# Patient Record
Sex: Female | Born: 1950 | Race: White | Hispanic: No | Marital: Married
Health system: Southern US, Community
[De-identification: ages and names within clinical notes are randomized; demographics above are authoritative.]

---

## 2005-07-04 ENCOUNTER — Encounter: Admission: RE | Admit: 2005-07-04 | Discharge: 2005-07-04 | Payer: Self-pay | Admitting: Endocrinology

## 2006-08-30 IMAGING — US US RENAL
1 series · 14 of 24 positions shown · non-contrast
Comparison: none

CLINICAL DATA: Renal vascular hypertension.  Evaluate kidney size.  
 RENAL ULTRASOUND:
TECHNIQUE: Complete ultrasound examination of the urinary tract was performed including evaluation of the kidneys, renal collecting systems, and urinary bladder.

[Series 1: unknown · 0.19mm/px · 14 of 24 slices shown]
[im 1/24]
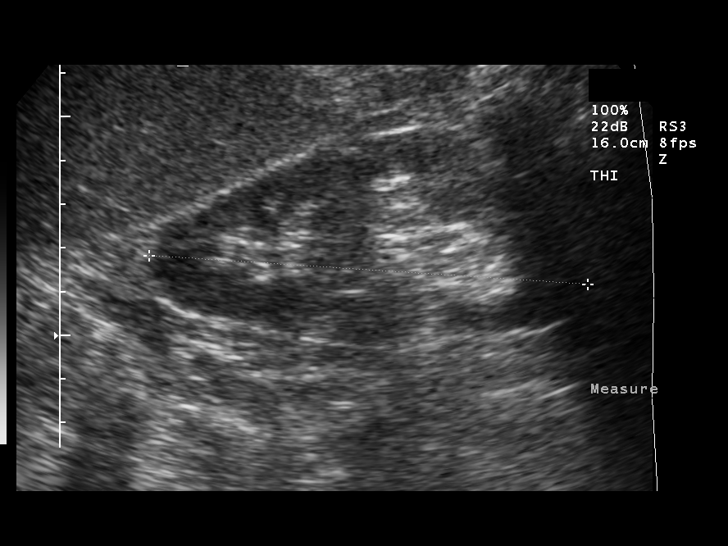
[im 3/24]
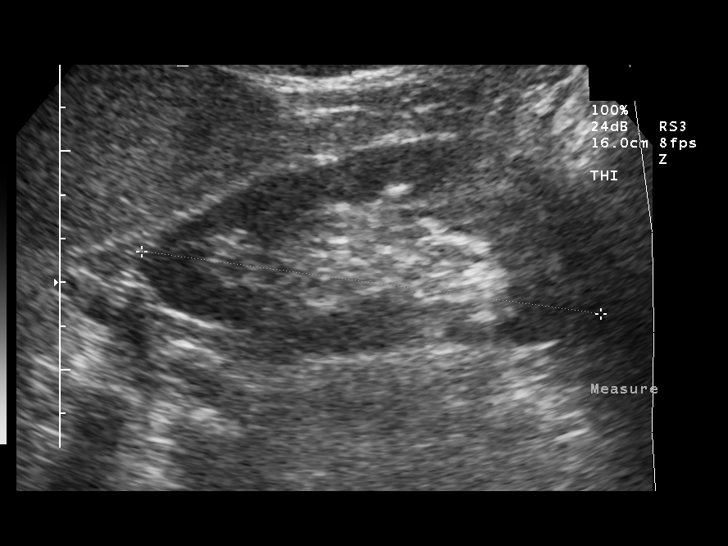
[im 5/24]
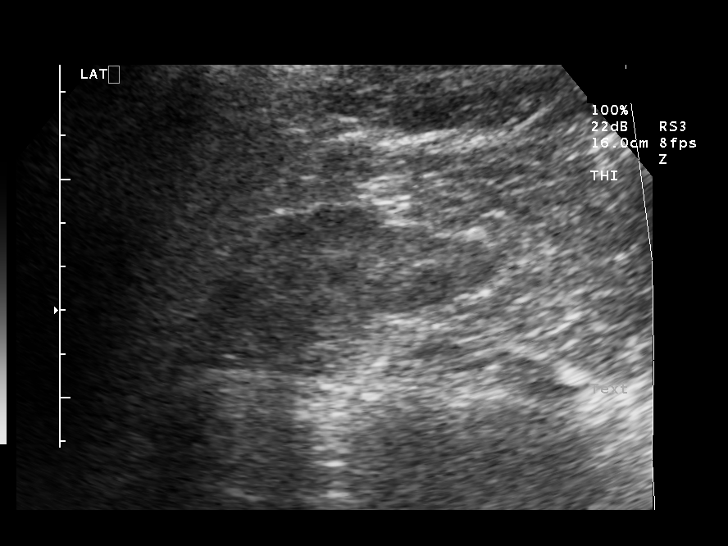
[im 7/24]
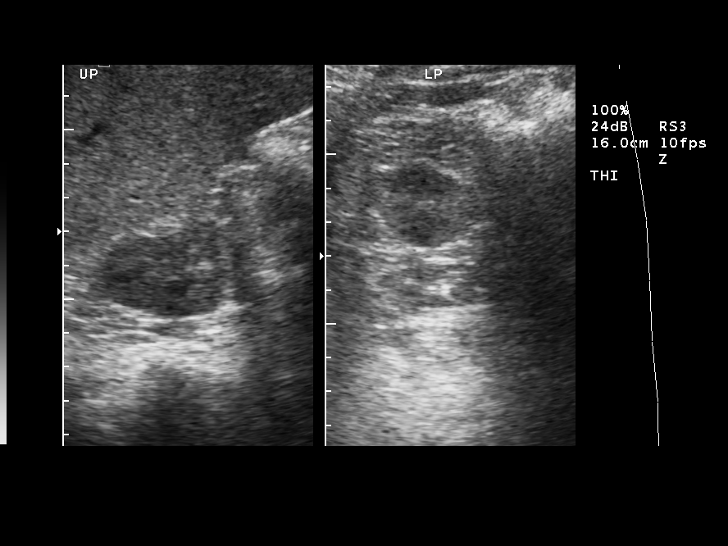
[im 8/24]
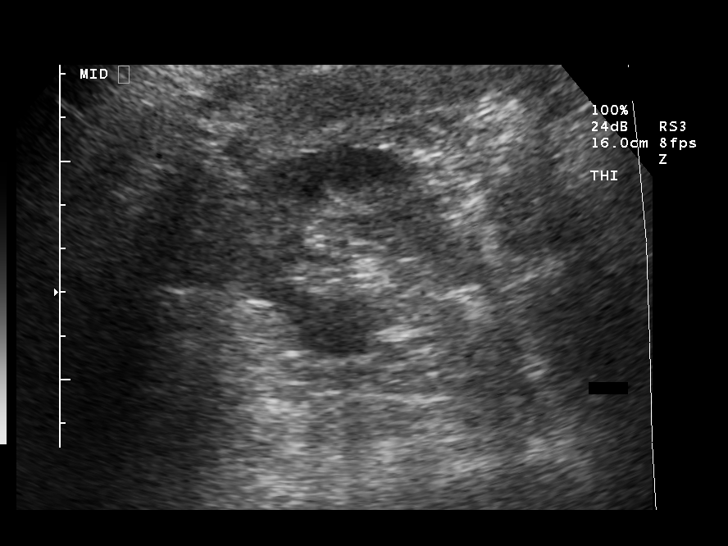
[im 10/24]
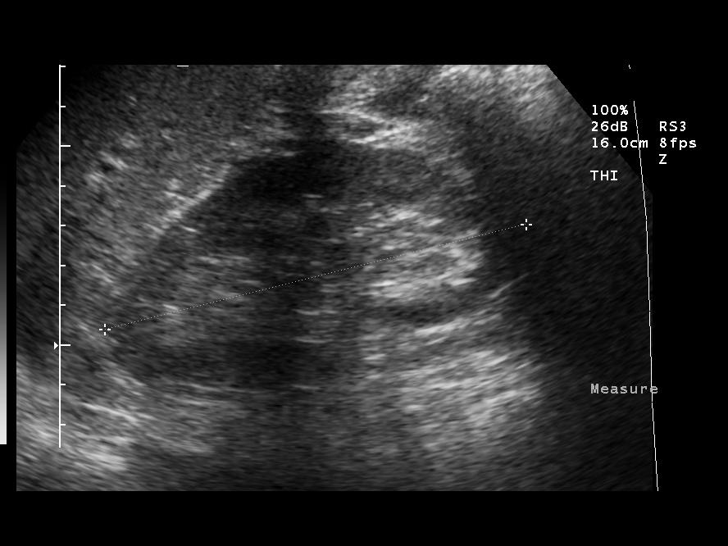
[im 12/24]
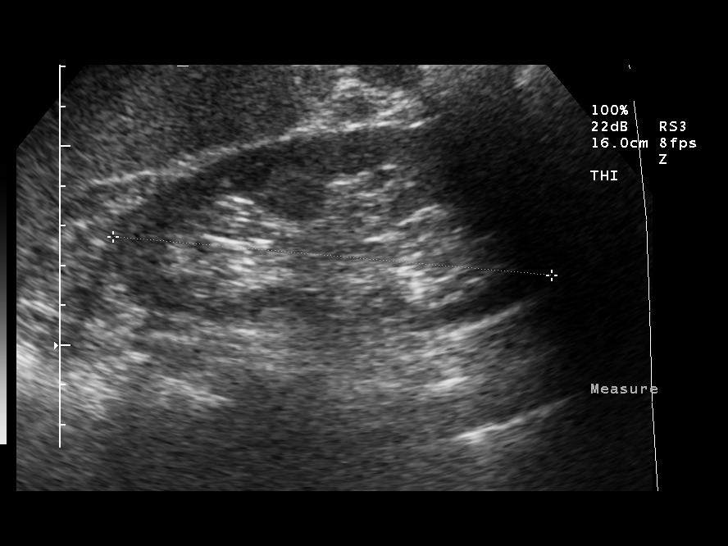
[im 13/24]
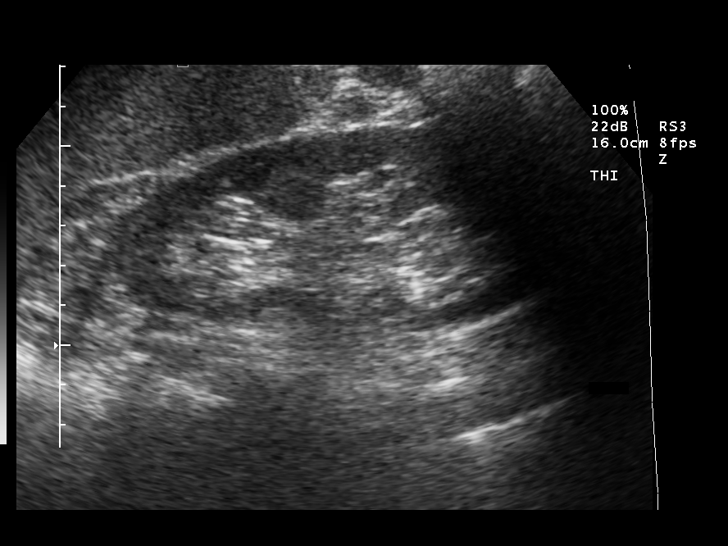
[im 15/24]
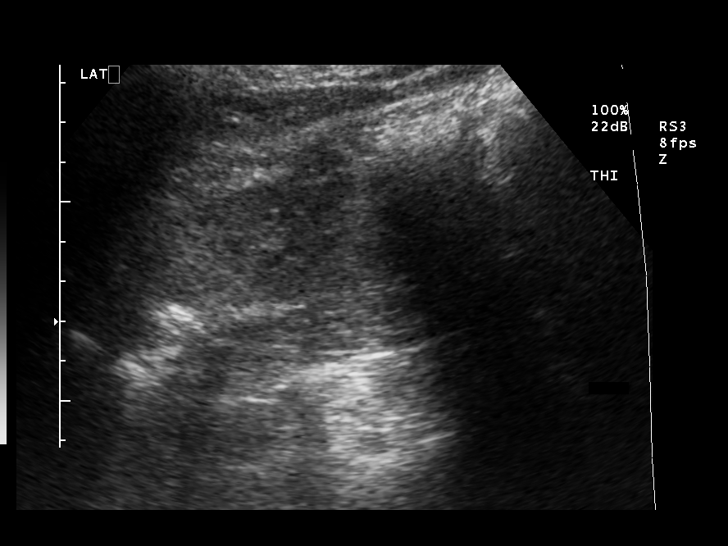
[im 17/24]
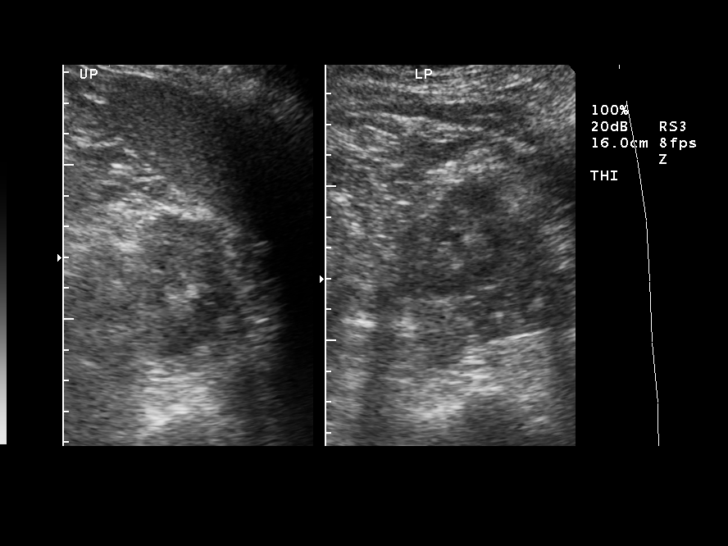
[im 19/24]
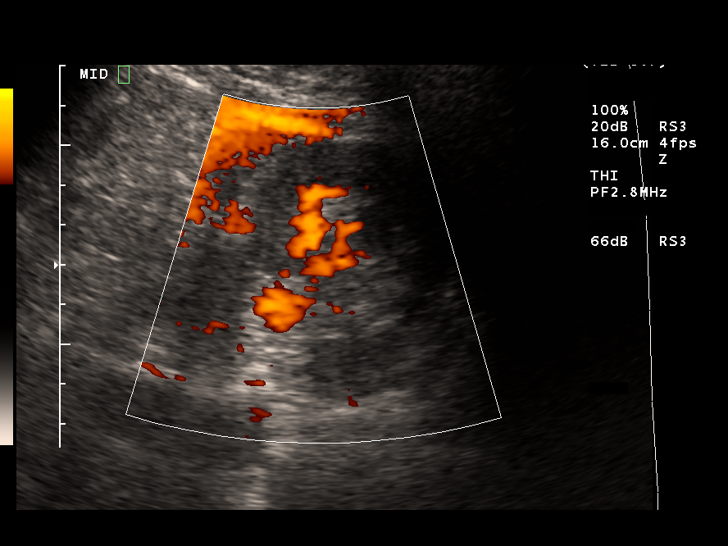
[im 20/24]
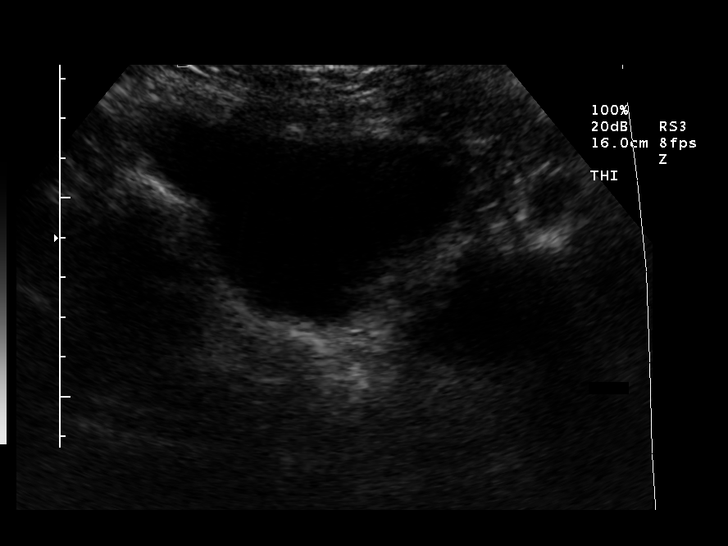
[im 22/24]
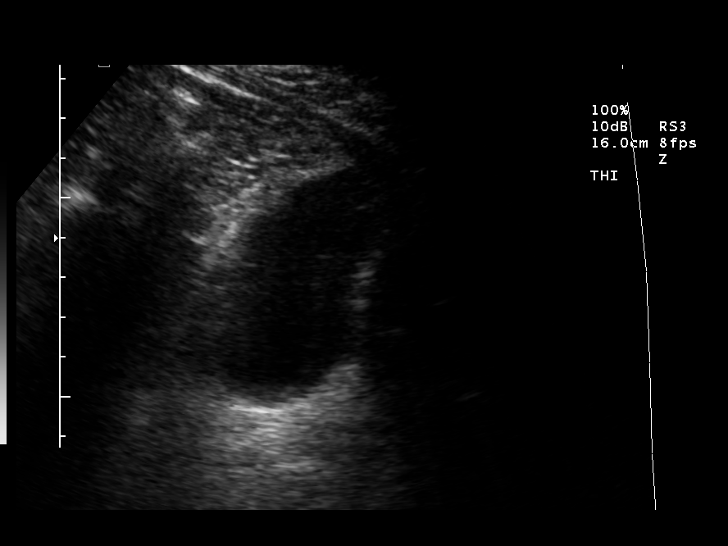
[im 24/24]
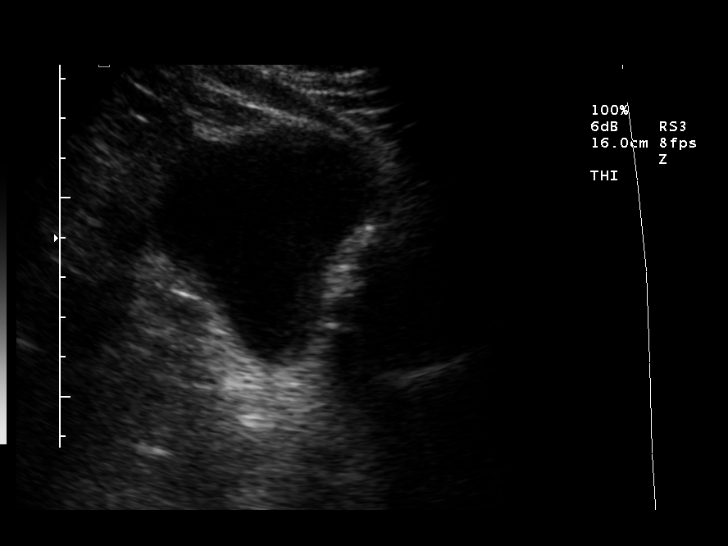

[14 of 24 positions shown; findings below may reference images not displayed]

FINDINGS: Both kidneys are within normal limits in size and parenchymal echogenicity.  No renal parenchymal lesions are seen.  There is no evidence of hydronephrosis.  No perinephric abnormalities are seen.
 Images of the urinary bladder are unremarkable for the degree of bladder filling.  Right kidney measures 10.6cm.  Left kidney measures 11.1cm.
IMPRESSION: Normal study.

## 2012-09-28 ENCOUNTER — Other Ambulatory Visit: Payer: Self-pay

## 2012-09-28 DIAGNOSIS — E049 Nontoxic goiter, unspecified: Secondary | ICD-10-CM

## 2012-10-01 ENCOUNTER — Ambulatory Visit
Admission: RE | Admit: 2012-10-01 | Discharge: 2012-10-01 | Disposition: A | Discharge status: Home / Self Care | Payer: BC Managed Care – PPO | Source: Ambulatory Visit

## 2012-10-01 DIAGNOSIS — E049 Nontoxic goiter, unspecified: Secondary | ICD-10-CM

## 2013-11-27 IMAGING — US US SOFT TISSUE HEAD/NECK
1 series · 14 of 25 positions shown · non-contrast
Comparison: None.

CLINICAL DATA: Goiter

THYROID ULTRASOUND
TECHNIQUE: Ultrasound examination of the thyroid gland and adjacent
soft tissues was performed.

[Series 1: us soft tissue head/neck · 0.05mm/px · 14 of 36 slices shown]
[im 1/36]
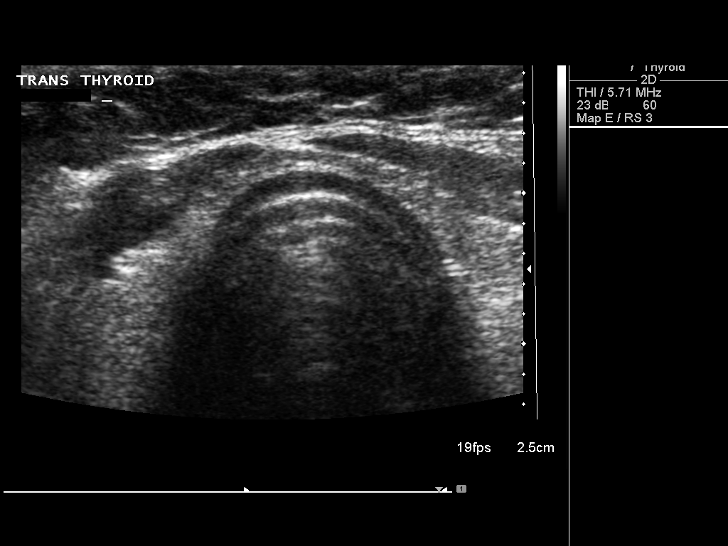
[im 3/36]
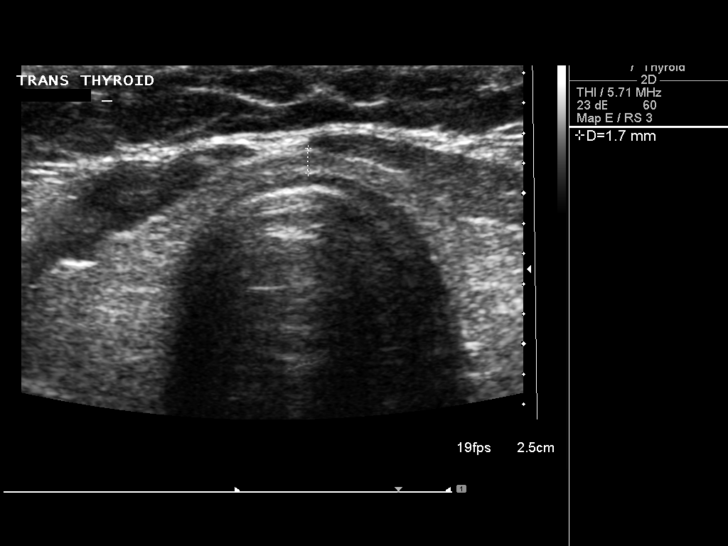
[im 6/36]
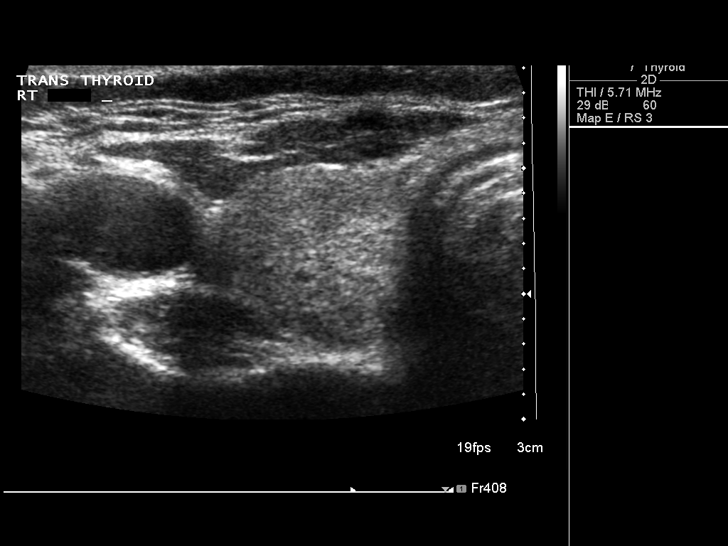
[im 9/36]
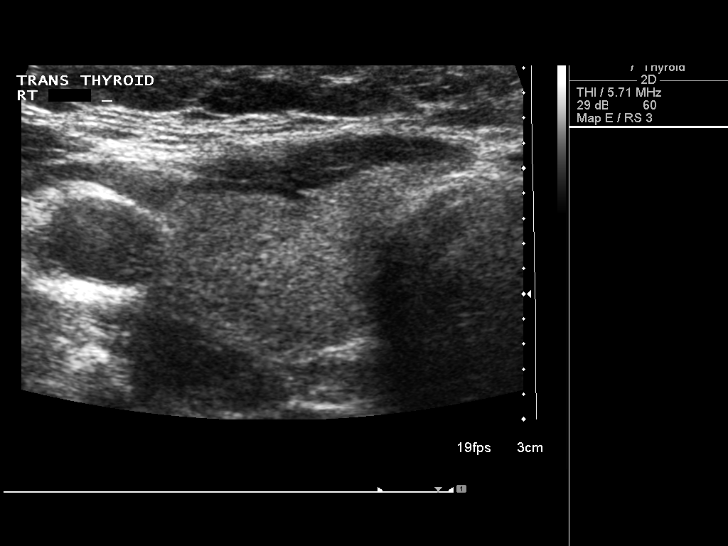
[im 12/36]
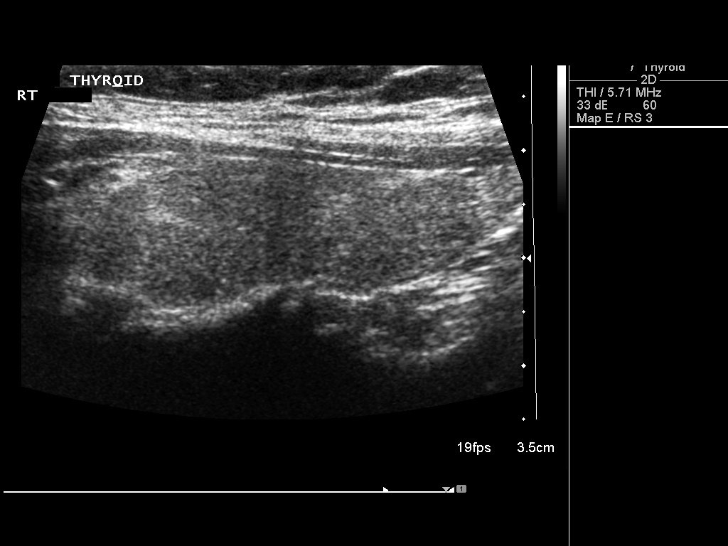
[im 14/36]
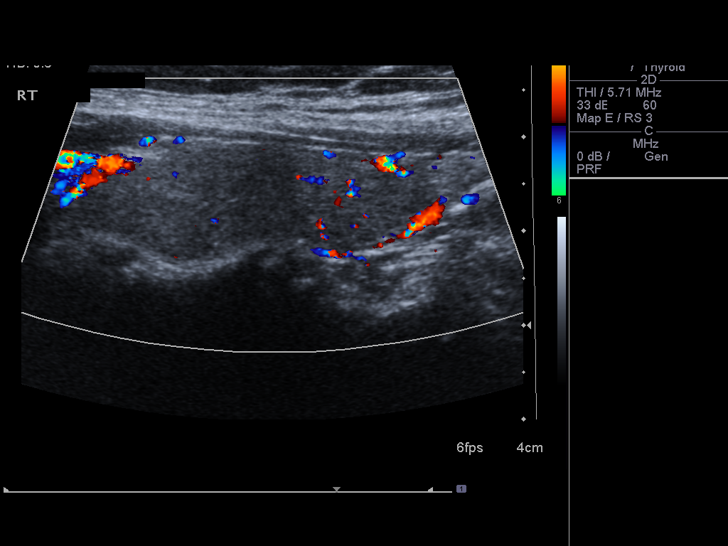
[im 17/36]
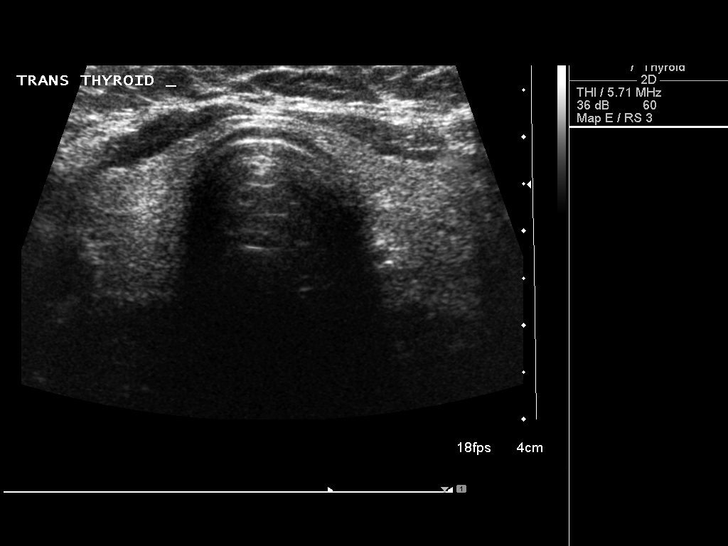
[im 19/36]
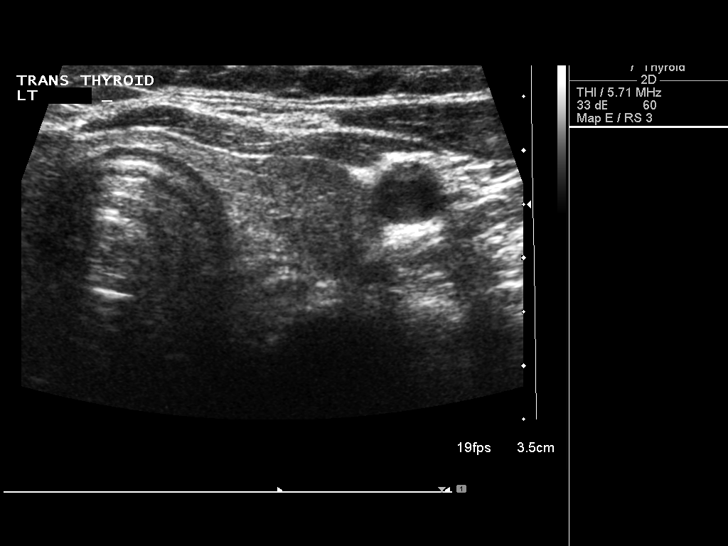
[im 22/36]
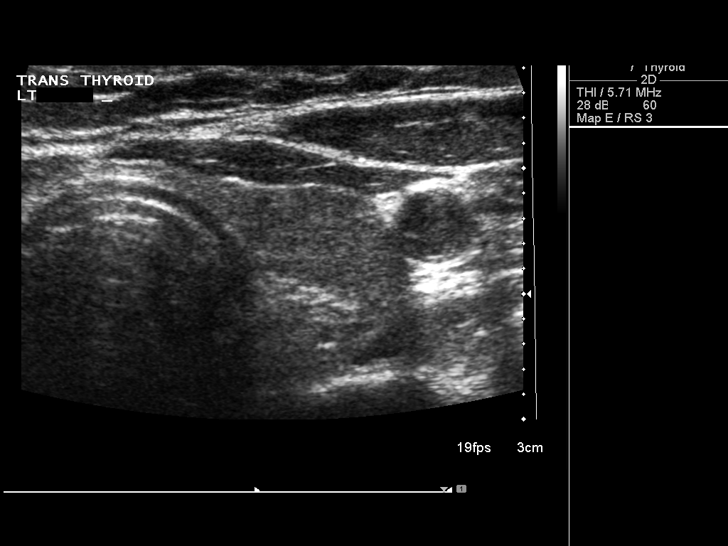
[im 24/36]
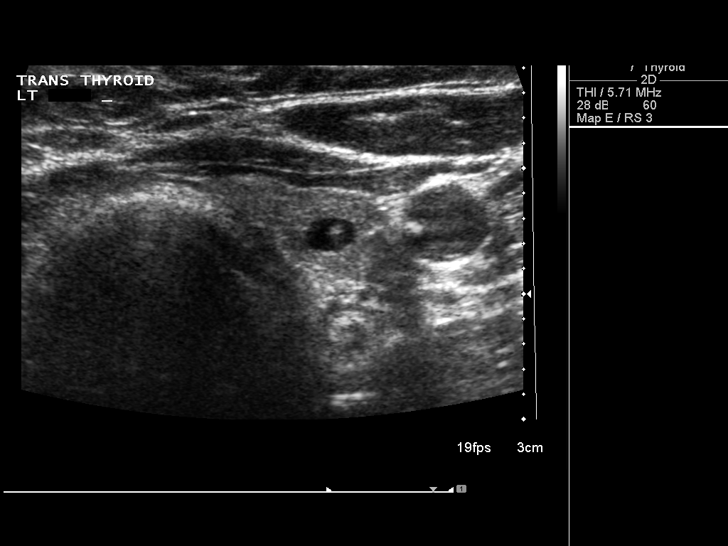
[im 27/36]
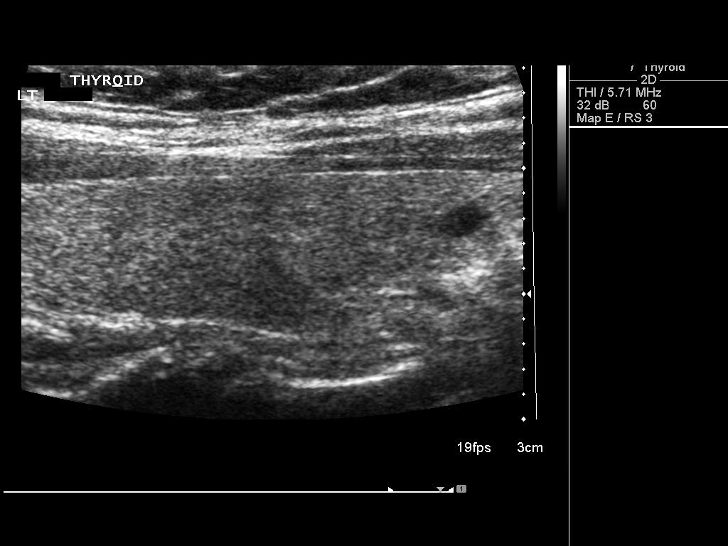
[im 30/36]
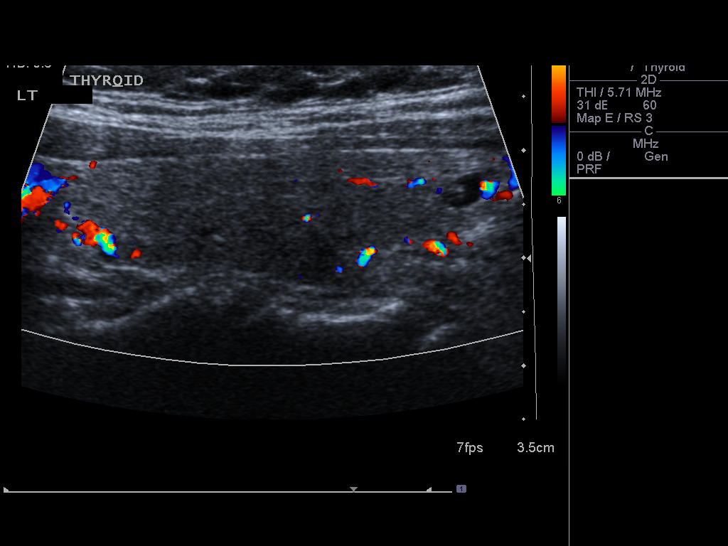
[im 33/36]
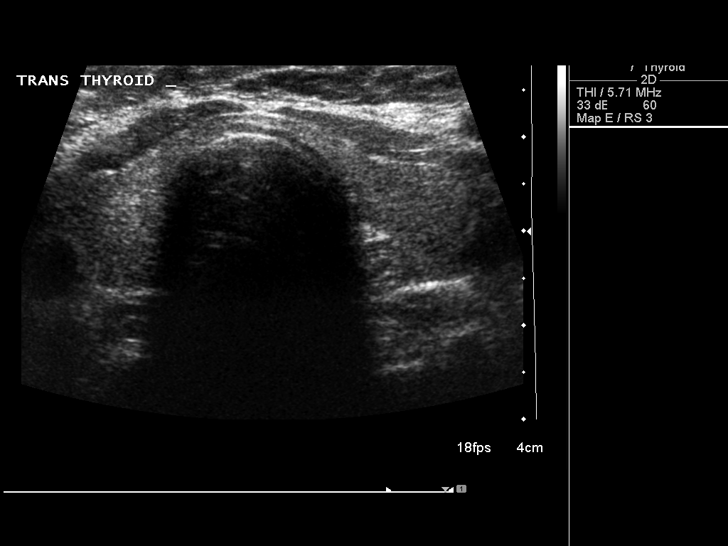
[im 36/36]
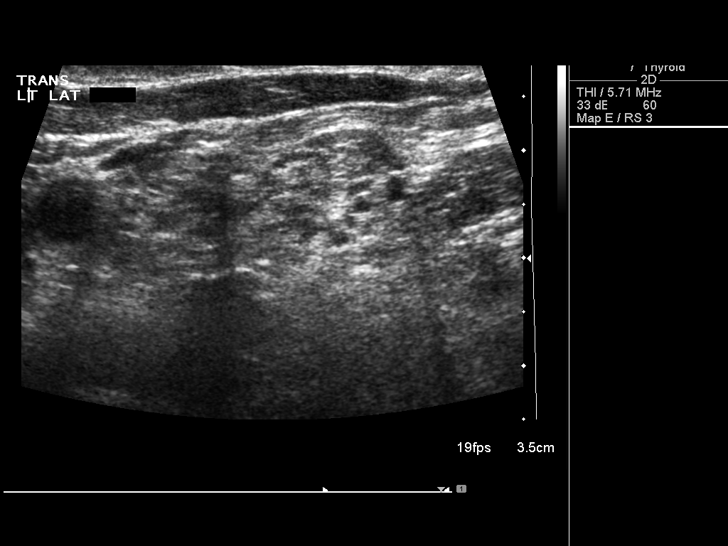

[14 of 25 positions shown; findings below may reference images not displayed]

FINDINGS: Right thyroid lobe:  13 x 16 x 46 mm, homogeneous background
echotexture
Left thyroid lobe:  12 x 15 x 48 mm
Isthmus:  1.7 mm in thickness

Focal nodules:  4 mm complex mostly cystic, inferior left

Lymphadenopathy:  None visualized.
IMPRESSION: 1.  Normal sized thyroid with a single small left cystic lesion.
Findings do not meet current consensus criteria for biopsy.  Follow-
up by clinical exam is recommended.  If patient has known risk
factors for thyroid carcinoma, consider follow-up ultrasound in 12
months.  If patient is clinically hyperthyroid, consider nuclear
medicine thyroid uptake and scan. This recommendation follows the
consensus statement:  Management of Thyroid Nodules Detected as US:
Society of Radiologists in Ultrasound Consensus Conference

## 2015-04-13 ENCOUNTER — Other Ambulatory Visit: Payer: Self-pay | Admitting: Family Medicine

## 2015-04-13 DIAGNOSIS — E041 Nontoxic single thyroid nodule: Secondary | ICD-10-CM

## 2015-04-19 ENCOUNTER — Ambulatory Visit
Admission: RE | Admit: 2015-04-19 | Discharge: 2015-04-19 | Disposition: A | Discharge status: Home / Self Care | Payer: BC Managed Care – PPO | Source: Ambulatory Visit | Attending: Family Medicine | Admitting: Family Medicine

## 2015-04-19 DIAGNOSIS — E041 Nontoxic single thyroid nodule: Secondary | ICD-10-CM

## 2016-03-25 ENCOUNTER — Other Ambulatory Visit: Payer: Self-pay | Admitting: Family Medicine

## 2016-03-25 DIAGNOSIS — E041 Nontoxic single thyroid nodule: Secondary | ICD-10-CM

## 2016-04-01 ENCOUNTER — Ambulatory Visit
Admission: RE | Admit: 2016-04-01 | Discharge: 2016-04-01 | Disposition: A | Discharge status: Home / Self Care | Payer: BC Managed Care – PPO | Source: Ambulatory Visit | Attending: Family Medicine | Admitting: Family Medicine

## 2016-04-01 DIAGNOSIS — E041 Nontoxic single thyroid nodule: Secondary | ICD-10-CM

## 2017-05-28 IMAGING — US US THYROID
1 series · 14 of 25 positions shown · non-contrast
Comparison: 04/19/2015

CLINICAL DATA: Follow-up thyroid nodule.

EXAM:
THYROID ULTRASOUND
TECHNIQUE: Ultrasound examination of the thyroid gland and adjacent soft
tissues was performed.

[Series 1: us thyroid · 0.06mm/px · 14 of 35 slices shown]
[im 1/35]
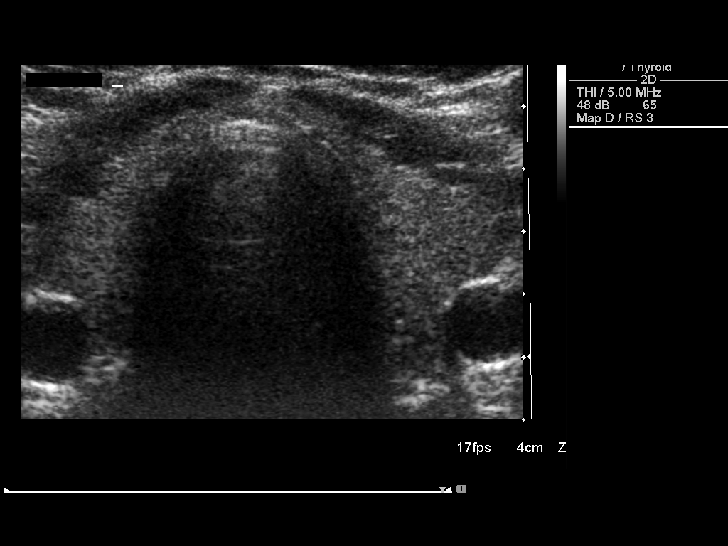
[im 3/35]
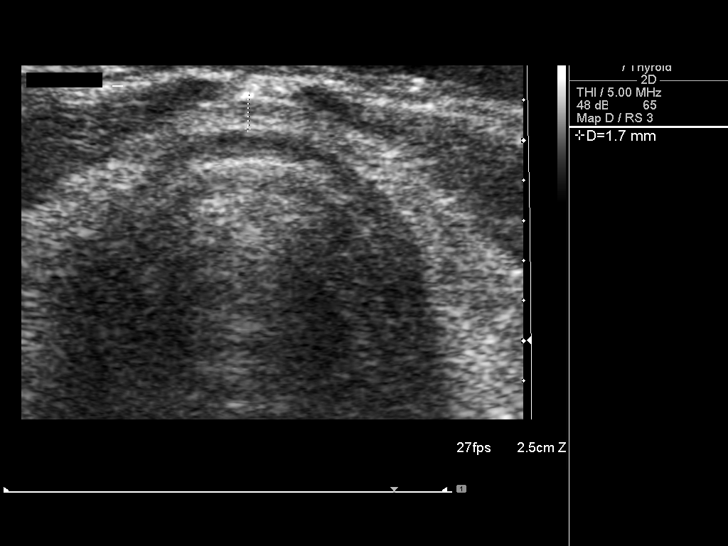
[im 6/35]
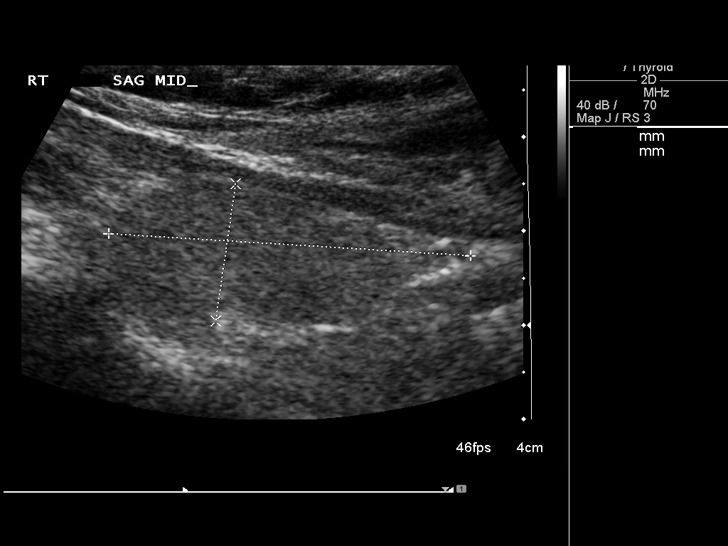
[im 9/35]
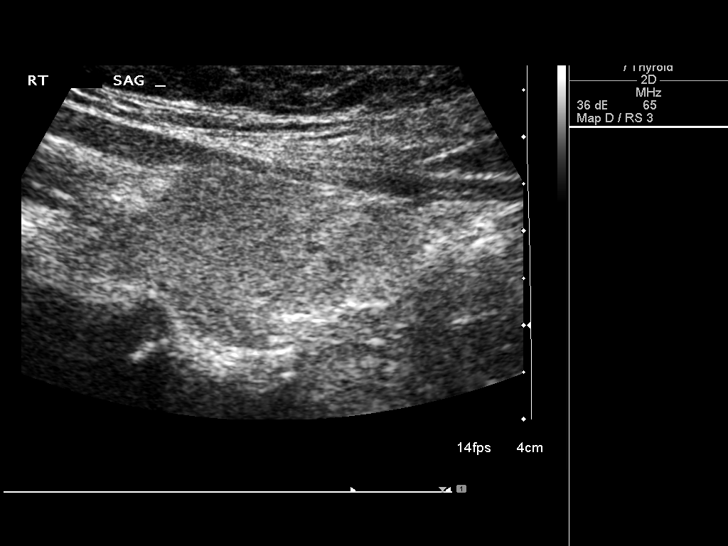
[im 12/35]
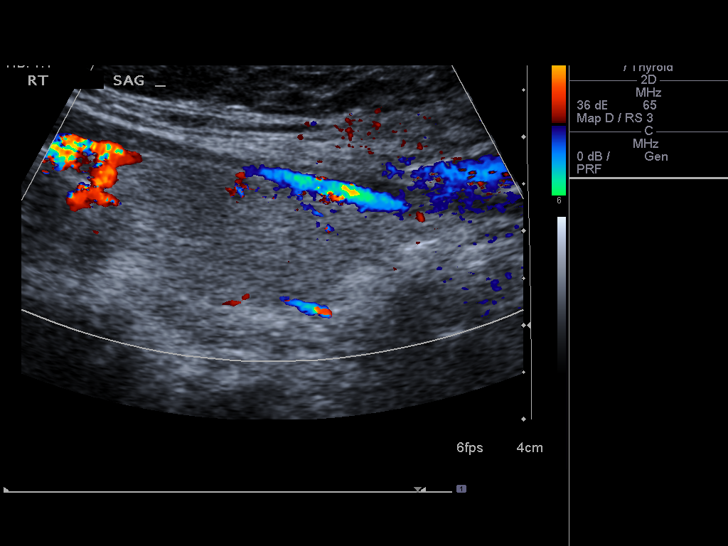
[im 13/35]
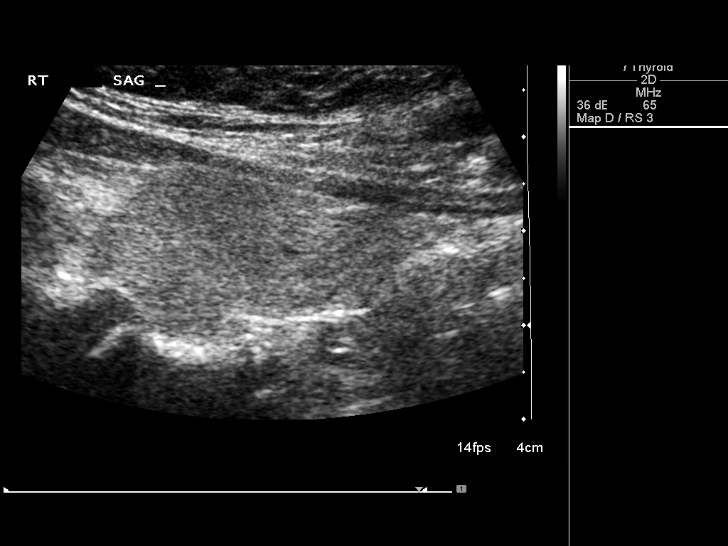
[im 16/35]
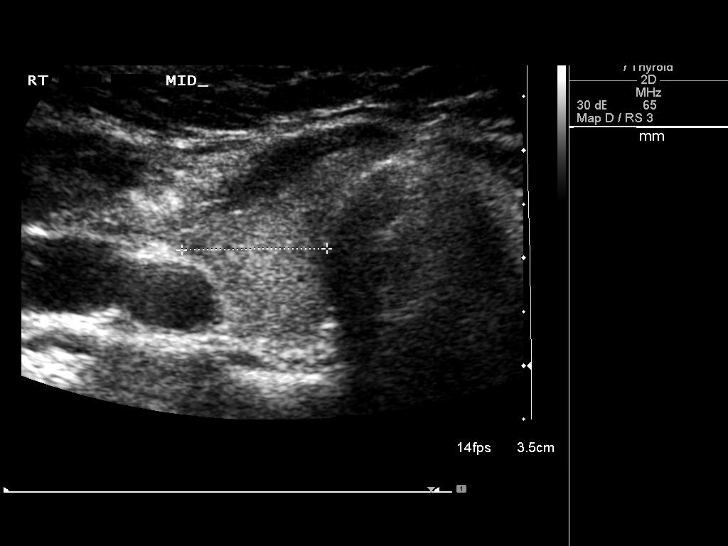
[im 19/35]
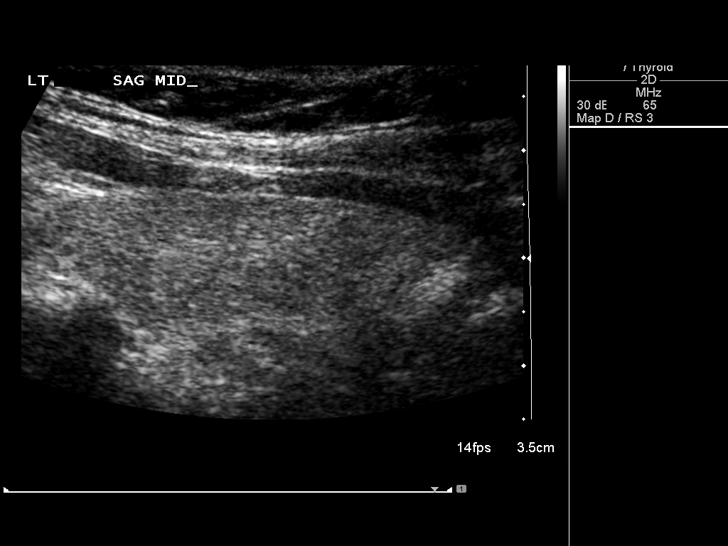
[im 22/35]
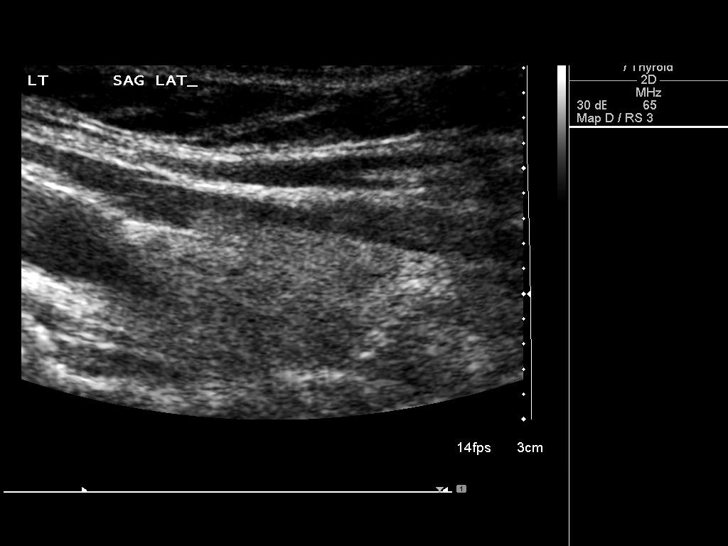
[im 23/35]
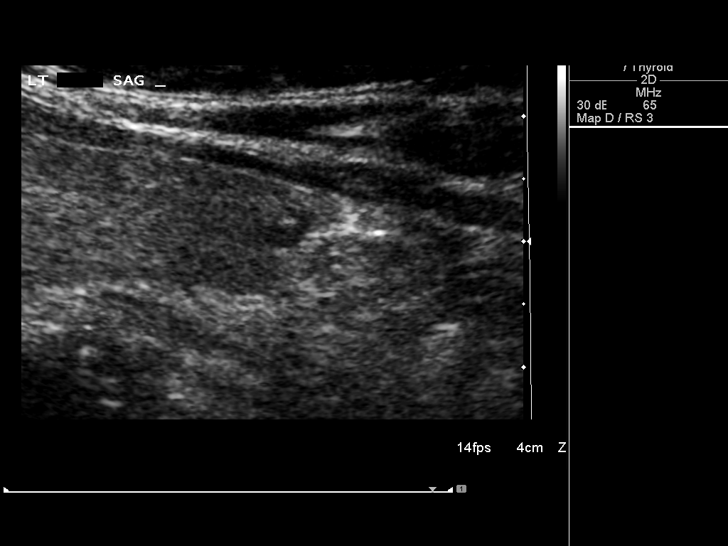
[im 26/35]
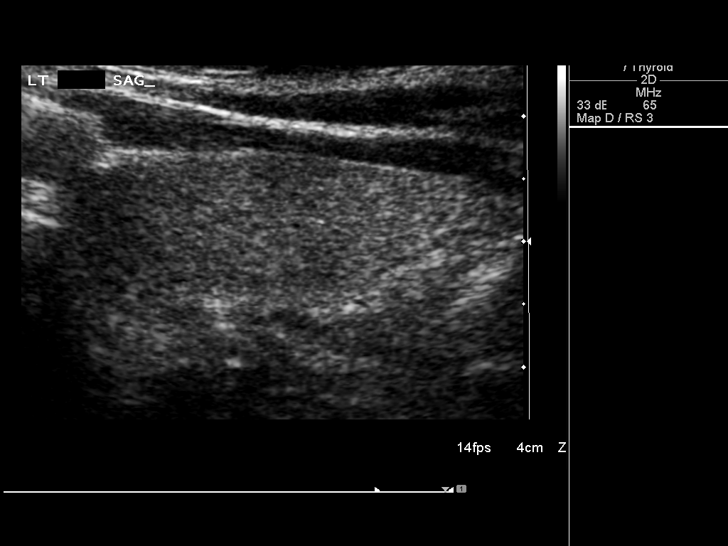
[im 29/35]
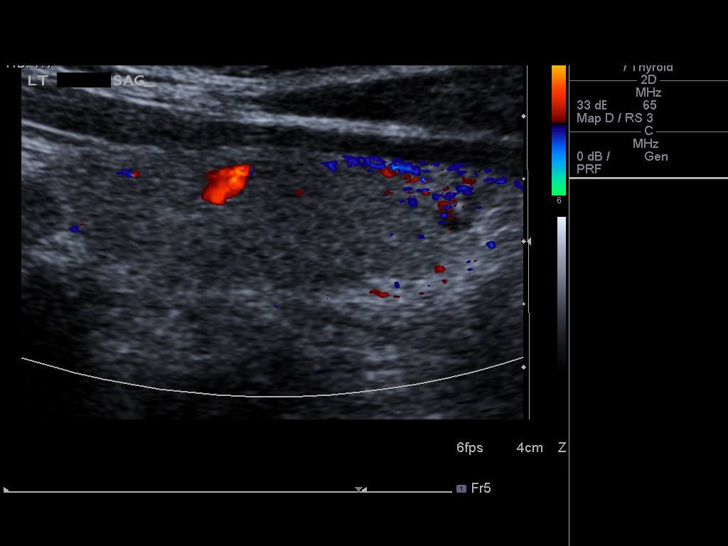
[im 32/35]
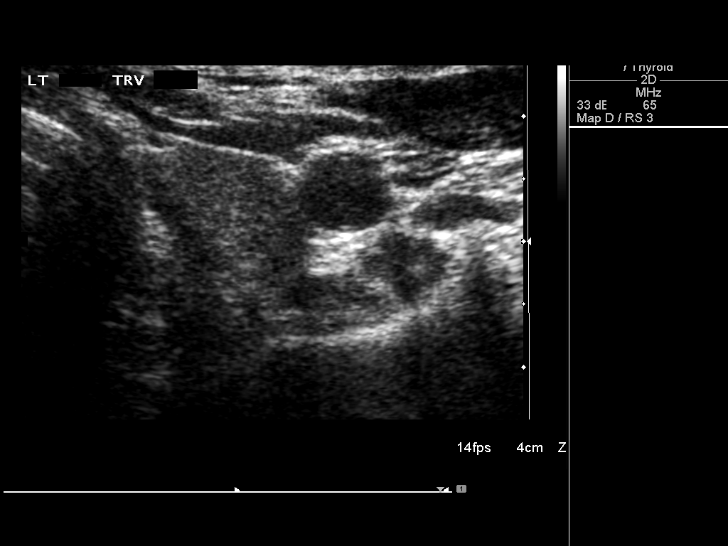
[im 35/35]
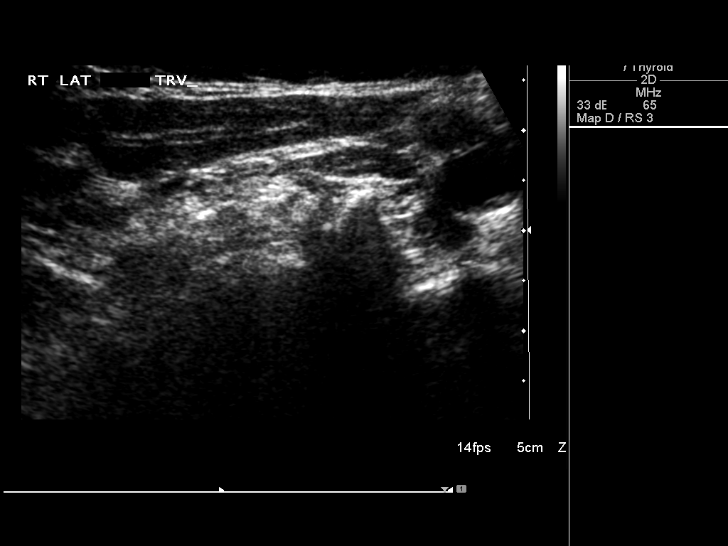

[14 of 25 positions shown; findings below may reference images not displayed]

FINDINGS: Right thyroid lobe

Measurements: 3.9 x 1.5 x 1.4 cm. Right thyroid tissue is mildly
heterogeneous without a discrete nodule.

Left thyroid lobe

Measurements: 4.1 x 1.3 x 1.3 cm. Left thyroid tissue is mildly
heterogeneous. A small heterogeneous nodule in the inferior aspect
that measures 0.5 x 0.4 x 0.4 cm, previously measured 0.6 x 0.3 x
0.4 cm. There is an echogenic focus in this nodule which is
unchanged. No other left thyroid nodules.

Isthmus

Thickness: 0.2 cm.  No nodules visualized.

Lymphadenopathy

None visualized.
IMPRESSION: Stable small left thyroid nodule.

## 2019-11-06 ENCOUNTER — Ambulatory Visit: Payer: BC Managed Care – PPO | Attending: Internal Medicine

## 2019-11-06 DIAGNOSIS — Z23 Encounter for immunization: Secondary | ICD-10-CM | POA: Insufficient documentation

## 2019-11-06 NOTE — Progress Notes (Signed)
   Covid-19 Vaccination Clinic  Name:  DAVE MANNES    MRN: [id number] DOB: 10-16-1950  11/06/2019  Ms. Karam was observed post Covid-19 immunization for 15 minutes without incidence. She was provided with Vaccine Information Sheet and instruction to access the V-Safe system.   Ms. Zalar was instructed to call 911 with any severe reactions post vaccine: Marland Kitchen Difficulty breathing  . Swelling of your face and throat  . A fast heartbeat  . A bad rash all over your body  . Dizziness and weakness    Immunizations Administered    Name Date Dose VIS Date Route   Pfizer COVID-19 Vaccine 11/06/2019  8:47 AM 0.3 mL 08/19/2019 Intramuscular   Manufacturer: [employer], [employer]   Lot: [id number]   NDC: [address]-1

## 2019-11-26 ENCOUNTER — Ambulatory Visit: Payer: BC Managed Care – PPO | Attending: Internal Medicine

## 2019-11-26 DIAGNOSIS — Z23 Encounter for immunization: Secondary | ICD-10-CM

## 2019-11-26 NOTE — Progress Notes (Signed)
   Covid-19 Vaccination Clinic  Name:  QUINETTA SHILLING    MRN: [id number] DOB: 03/26/1951  11/26/2019  Ms. Zanella was observed post Covid-19 immunization for 15 minutes without incident. She was provided with Vaccine Information Sheet and instruction to access the V-Safe system.   Ms. Donnell was instructed to call 911 with any severe reactions post vaccine: Marland Kitchen Difficulty breathing  . Swelling of face and throat  . A fast heartbeat  . A bad rash all over body  . Dizziness and weakness   Immunizations Administered    Name Date Dose VIS Date Route   Pfizer COVID-19 Vaccine 11/26/2019  9:17 AM 0.3 mL 08/19/2019 Intramuscular   Manufacturer: [employer], [employer]   Lot: [id number]   NDC: [address]-6
